# Patient Record
Sex: Male | Born: 1970 | Hispanic: Yes | Marital: Married | Smoking: Never smoker
Health system: Southern US, Community
[De-identification: ages and names within clinical notes are randomized; demographics above are authoritative.]

---

## 2006-03-19 ENCOUNTER — Emergency Department: Payer: Self-pay | Admitting: Emergency Medicine

## 2016-08-21 ENCOUNTER — Emergency Department
Admission: EM | Admit: 2016-08-21 | Discharge: 2016-08-21 | Disposition: A | Discharge status: Home / Self Care | Payer: Self-pay | Attending: Emergency Medicine | Admitting: Emergency Medicine

## 2016-08-21 ENCOUNTER — Encounter: Payer: Self-pay | Admitting: Emergency Medicine

## 2016-08-21 ENCOUNTER — Emergency Department: Payer: Self-pay

## 2016-08-21 DIAGNOSIS — N201 Calculus of ureter: Secondary | ICD-10-CM | POA: Insufficient documentation

## 2016-08-21 DIAGNOSIS — N23 Unspecified renal colic: Secondary | ICD-10-CM | POA: Insufficient documentation

## 2016-08-21 LAB — URINALYSIS, COMPLETE (UACMP) WITH MICROSCOPIC
Bacteria, UA: NONE SEEN
Bilirubin Urine: NEGATIVE
Glucose, UA: NEGATIVE mg/dL
HGB URINE DIPSTICK: NEGATIVE
KETONES UR: NEGATIVE mg/dL
LEUKOCYTES UA: NEGATIVE
NITRITE: NEGATIVE
PH: 7 (ref 5.0–8.0)
PROTEIN: NEGATIVE mg/dL
Specific Gravity, Urine: 1.012 (ref 1.005–1.030)

## 2016-08-21 MED ORDER — TAMSULOSIN HCL 0.4 MG PO CAPS: 0.4000 mg | ORAL_CAPSULE | Freq: Every day | ORAL | 0 refills | Status: DC

## 2016-08-21 MED ORDER — NAPROXEN 500 MG PO TABS: 500.0000 mg | ORAL_TABLET | Freq: Two times a day (BID) | ORAL | 0 refills | Status: DC

## 2016-08-21 MED ORDER — PHENAZOPYRIDINE HCL 200 MG PO TABS: 200.0000 mg | ORAL_TABLET | Freq: Three times a day (TID) | ORAL | 0 refills | Status: DC | PRN

## 2016-08-21 NOTE — ED Triage Notes (Signed)
[facility] for right lower , groin pain that has been coming and going for 3 weeks, increasing to today is worse, 10/10. Pt also feels pain in testicles. At this time pain is 6/10

## 2016-08-21 NOTE — Discharge Instructions (Signed)
Your CT scan shows a 6 mm kidney stone just outside the bladder on the right side. This is the cause of your symptoms. By taking Flomax every day, this may be allowed to pass into the bladder on its own without requiring urological intervention. If your symptoms do not resolve over the weekend, please follow-up with urology on Monday.

## 2016-08-21 NOTE — ED Notes (Signed)
See triage note  States he has been having pain to lower bad for about 3 weeks ,off and on  But states today the pain became worse this am  Pain is moving into testicle area  Denies any n/v or blood in [place]

## 2016-08-21 NOTE — ED Provider Notes (Signed)
[facility] Emergency Department Provider Note  ____________________________________________  Time seen: Approximately 1:52 PM  I have reviewed the triage vital signs and the nursing notes.   HISTORY  Chief Complaint Abdominal Pain    HPI Erik Lawrence is a 46 y.o. male who complains of right flank pain radiating to the right lower quadrant and groin for the past week, colicky lasting up to an hour at a time, but today is been worse and constant for the past 5 hours. No nausea vomiting fevers chills or sweats. He does have some dysuria but no hematuria or frequency or urgency. At times the pain even radiates to the right testicle. Denies trauma. Has a history of kidney stone and this feels similar.     History reviewed. No pertinent past medical history. Kidney stone  There are no active problems to display for this patient.    History reviewed. No pertinent surgical history.   Prior to Admission medications   Medication Sig Start Date End Date Taking? Authorizing Provider  naproxen (NAPROSYN) 500 MG tablet Take 1 tablet (500 mg total) by mouth 2 (two) times daily with a meal. 08/21/16   Sharman Cheek, MD  phenazopyridine (PYRIDIUM) 200 MG tablet Take 1 tablet (200 mg total) by mouth 3 (three) times daily as needed for pain. 08/21/16   Sharman Cheek, MD  tamsulosin (FLOMAX) 0.4 MG CAPS capsule Take 1 capsule (0.4 mg total) by mouth daily. 08/21/16   Sharman Cheek, MD     Allergies Patient has no known allergies.   History reviewed. No pertinent family history.  Social History Social History  Substance Use Topics  . Smoking status: Never Smoker  . Smokeless tobacco: Never Used  . Alcohol use Yes    Review of Systems  Constitutional:   No fever or chills.  ENT:   No sore throat. No rhinorrhea. Cardiovascular:   No chest pain. Respiratory:   No dyspnea or cough. Gastrointestinal:   Right flank and right lower quadrant abdominal pain  without vomiting and diarrhea.  Genitourinary:   Positive dysuria. Musculoskeletal:   Negative for focal pain or swelling Neurological:   Negative for headaches 10-point ROS otherwise negative.  ____________________________________________   PHYSICAL EXAM:  VITAL SIGNS: ED Triage Vitals  Enc Vitals Group     BP 08/21/16 1208 115/63     Pulse Rate 08/21/16 1208 94     Resp --      Temp 08/21/16 1208 97.4 F (36.3 C)     Temp Source 08/21/16 1208 Oral     SpO2 08/21/16 1208 99 %     Weight 08/21/16 1209 271 lb (122.9 kg)     Height 08/21/16 1209 5\' 9"  (1.753 m)     Head Circumference --      Peak Flow --      Pain Score 08/21/16 1210 6     Pain Loc --      Pain Edu? --      Excl. in GC? --     Vital signs reviewed, nursing assessments reviewed.   Constitutional:   Alert and oriented. Well appearing and in no distress. Eyes:   No scleral icterus. No conjunctival pallor. PERRL. EOMI.  No nystagmus. ENT   Head:   Normocephalic and atraumatic.   Nose:   No congestion/rhinnorhea. No septal hematoma   Mouth/Throat:   MMM, no pharyngeal erythema. No peritonsillar mass.    Neck:   No stridor. No SubQ emphysema. No meningismus. Hematological/Lymphatic/Immunilogical:  No cervical lymphadenopathy. Cardiovascular:   RRR. Symmetric bilateral radial and DP pulses.  No murmurs.  Respiratory:   Normal respiratory effort without tachypnea nor retractions. Breath sounds are clear and equal bilaterally. No wheezes/rales/rhonchi. Gastrointestinal:   Soft With diffuse lower abdominal tenderness, mild. No focal tenderness at McBurney's point.. Non distended. There is no CVA tenderness.  No rebound, rigidity, or guarding. Genitourinary:   Uncircumcised male, normal genitalia. No meatal discharge or lesions. Testes and epididymis nontender. No scrotal edema. Unremarkable perineum. No hernia in the scrotum or inguinal canal with Valsalva. No inguinal lymphadenopathy or  masses. Musculoskeletal:   Normal range of motion in all extremities. No joint effusions.  No lower extremity tenderness.  No edema. Neurologic:   Normal speech and language.  CN 2-10 normal. Motor grossly intact. No gross focal neurologic deficits are appreciated.  Skin:    Skin is warm, dry and intact. No rash noted.  No petechiae, purpura, or bullae.  ____________________________________________    LABS (pertinent positives/negatives) (all labs ordered are listed, but only abnormal results are displayed) Labs Reviewed  URINALYSIS, COMPLETE (UACMP) WITH MICROSCOPIC - Abnormal; Notable for the following:       Result Value   Color, Urine YELLOW (*)    APPearance CLEAR (*)    Squamous Epithelial / LPF 0-5 (*)    All other components within normal limits   ____________________________________________   EKG    ____________________________________________    RADIOLOGY  Ct Renal Stone Study  Result Date: 08/21/2016 CLINICAL DATA:  Right flank pain radiating to the groin, present for 3 weeks but increased today. EXAM: CT ABDOMEN AND PELVIS WITHOUT CONTRAST TECHNIQUE: Multidetector CT imaging of the abdomen and pelvis was performed following the standard protocol without IV contrast. COMPARISON:  03/19/2006 FINDINGS: Lower chest:  No contributory findings. Hepatobiliary: Hepatic steatosis with sparing at the gallbladder fossa.No evidence of biliary obstruction or stone. Pancreas: Unremarkable. Spleen: Unremarkable. Adrenals/Urinary Tract: Negative adrenals. 6 x 3 mm stone just above the right ureteral vesicular junction with moderate hydroureteronephrosis. Asymmetric right perinephric edema. Bilateral renal calculi, on coronal reformats 2 seen on the left and 3 on the right. The right-sided stone measures up to 5 mm. Left-sided calculu measures up to 5 mm in the upper pole. Presumed lower hilar cyst on the left. No left hydronephrosis. Unremarkable bladder. Stomach/Bowel:  No  obstruction. No appendicitis. Vascular/Lymphatic: No acute vascular abnormality. No mass or adenopathy. Reproductive:No pathologic findings. Other: No ascites or pneumoperitoneum. Musculoskeletal: No acute abnormalities. IMPRESSION: 1. Obstructing 6 x 3 mm stone just above the right UVJ. 2. Bilateral nephrolithiasis. 3. Hepatic steatosis. Electronically Signed   By: Marnee Spring M.D.   On: 08/21/2016 13:21    ____________________________________________   PROCEDURES Procedures  ____________________________________________   INITIAL IMPRESSION / ASSESSMENT AND PLAN / ED COURSE  Pertinent labs & imaging results that were available during my care of the patient were reviewed by me and considered in my medical decision making (see chart for details).  Patient presents with symptoms consistent with renal colic. Due to chronicity, CT scan performed. This shows a 5-6 mm stone at the right UVJ consistent with his symptoms. Start the patient on Flomax. Naproxen and Pyridium for symptom relief. Follow-up with urology in 3-4 days if symptoms do not resolve. No evidence of concurrent urinary tract infection pyelonephritis sepsis or abscess formation.         ____________________________________________   FINAL CLINICAL IMPRESSION(S) / ED DIAGNOSES  Final diagnoses:  Ureterolithiasis  Ureteral colic  New Prescriptions   NAPROXEN (NAPROSYN) 500 MG TABLET    Take 1 tablet (500 mg total) by mouth 2 (two) times daily with a meal.   PHENAZOPYRIDINE (PYRIDIUM) 200 MG TABLET    Take 1 tablet (200 mg total) by mouth 3 (three) times daily as needed for pain.   TAMSULOSIN (FLOMAX) 0.4 MG CAPS CAPSULE    Take 1 capsule (0.4 mg total) by mouth daily.     Portions of this note were generated with dragon dictation software. Dictation errors may occur despite best attempts at proofreading.    Sharman CheekPhillip Jahmar Mckelvy, MD 08/21/16 1357

## 2017-11-21 IMAGING — CT CT RENAL STONE PROTOCOL
2 of 4 series · 17 of 46 positions shown, 19 images · non-contrast
Comparison: 03/19/2006

CLINICAL DATA: Right flank pain radiating to the groin, present for
3 weeks but increased today.

EXAM:
CT ABDOMEN AND PELVIS WITHOUT CONTRAST
TECHNIQUE: Multidetector CT imaging of the abdomen and pelvis was performed
following the standard protocol without IV contrast.

[Series 2: stone full standard · axial · 0.76mm/px · z∈[-773,-333]mm · 14 of 97 slices shown, 16 images]
[im 5/97  soft-tissue]
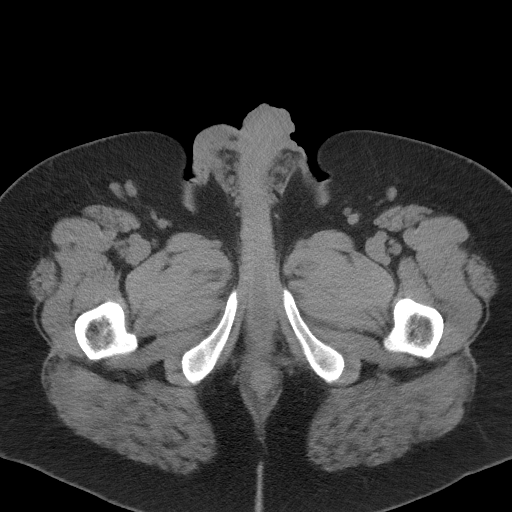
[im 5/97  bone]
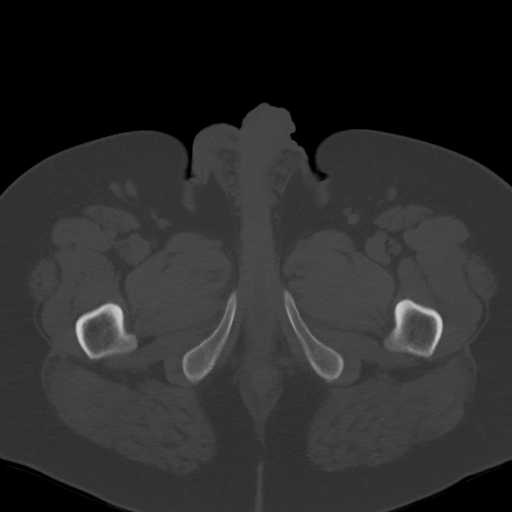
[im 13/97  soft-tissue]
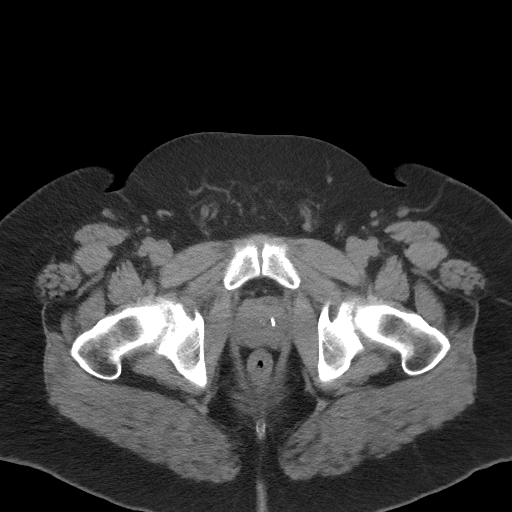
[im 21/97  soft-tissue]
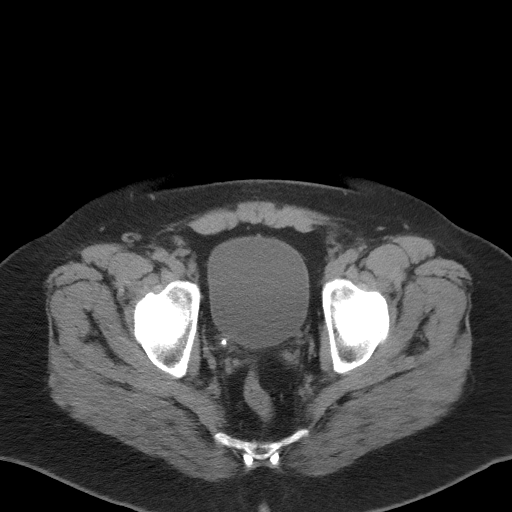
[im 25/97  soft-tissue]
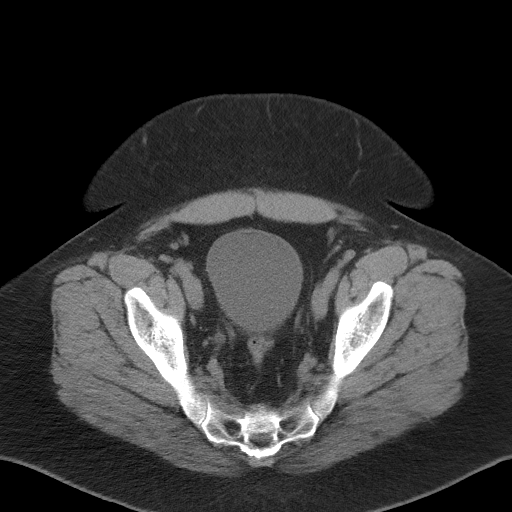
[im 33/97  soft-tissue]
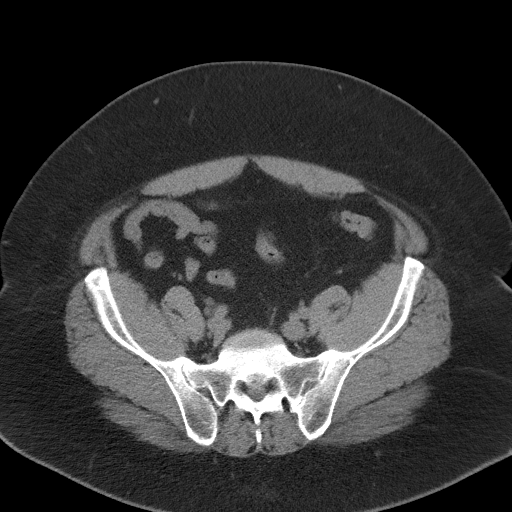
[im 41/97  soft-tissue]
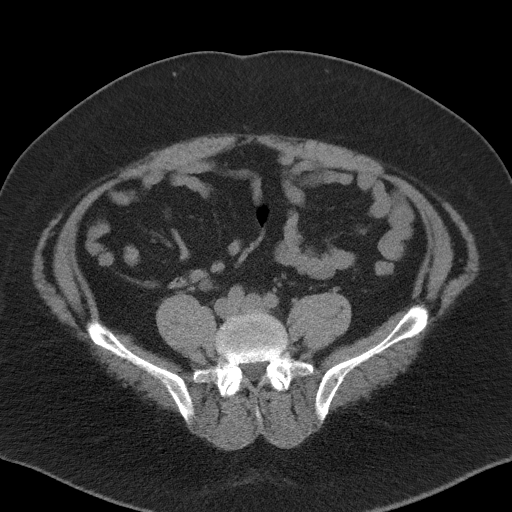
[im 45/97  soft-tissue]
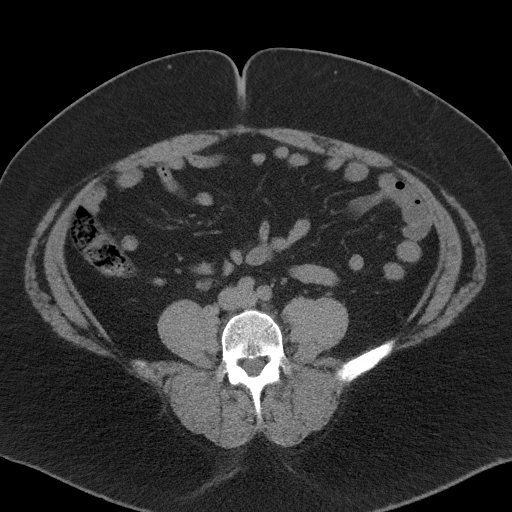
[im 53/97  soft-tissue]
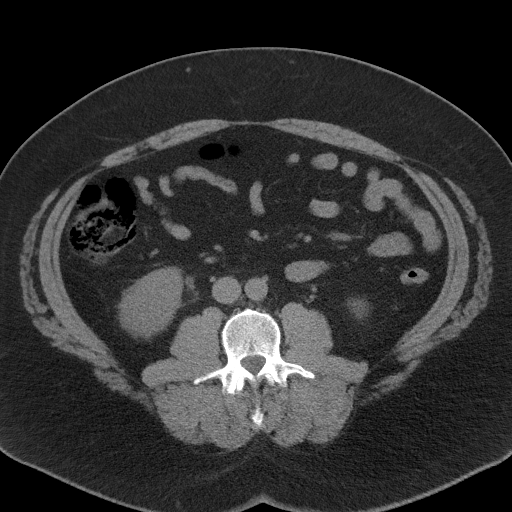
[im 57/97  soft-tissue]
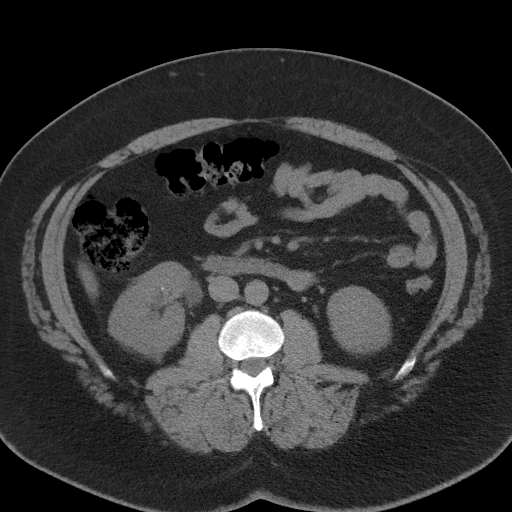
[im 57/97  bone]
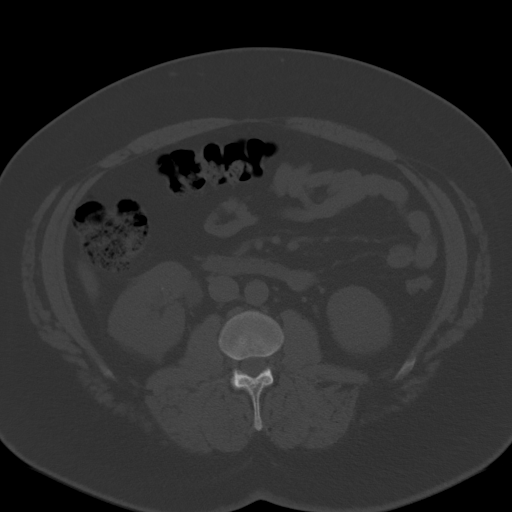
[im 65/97  soft-tissue]
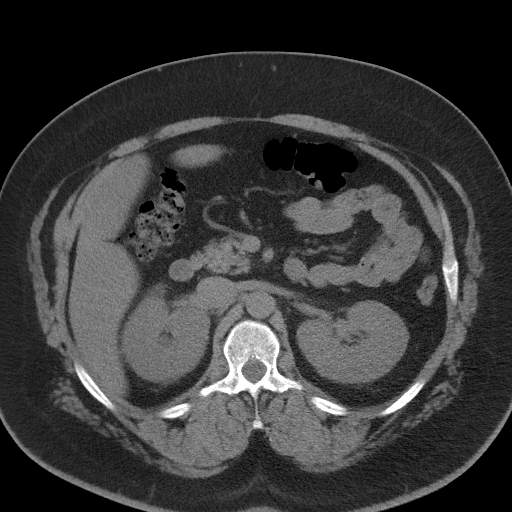
[im 73/97  soft-tissue]
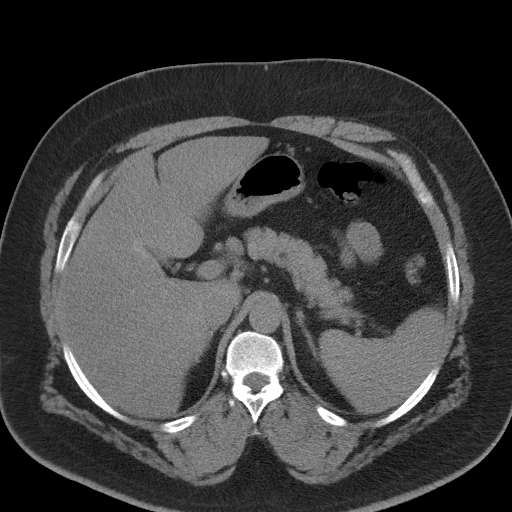
[im 77/97  soft-tissue]
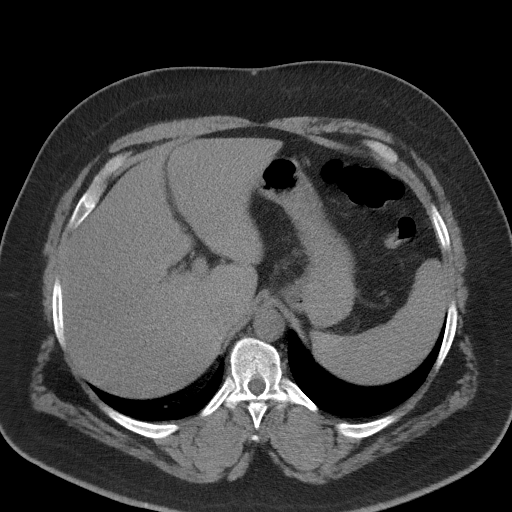
[im 85/97  soft-tissue]
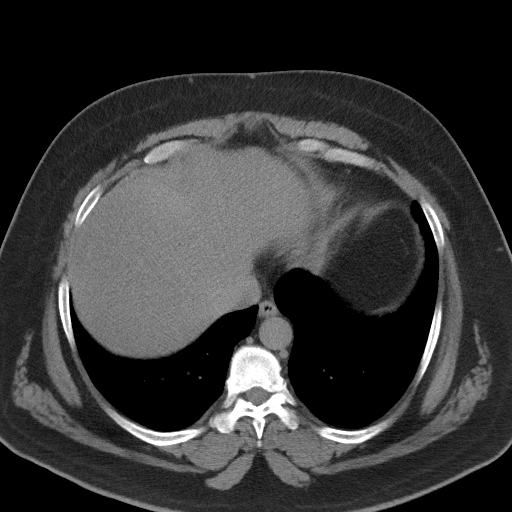
[im 93/97  soft-tissue]
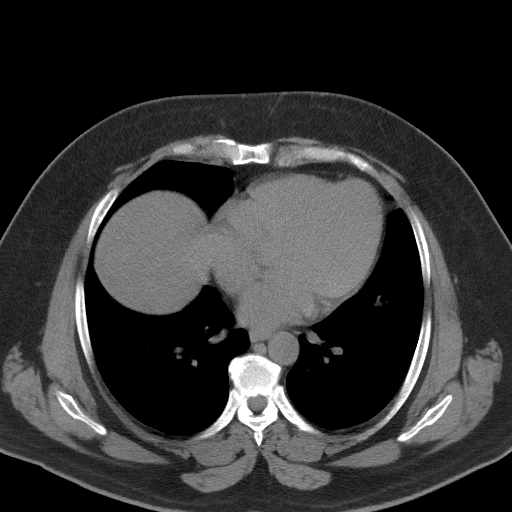

[Series 5: coronal · coronal · 0.89mm/px · 3 of 165 slices shown]
[im 55/165  soft-tissue]
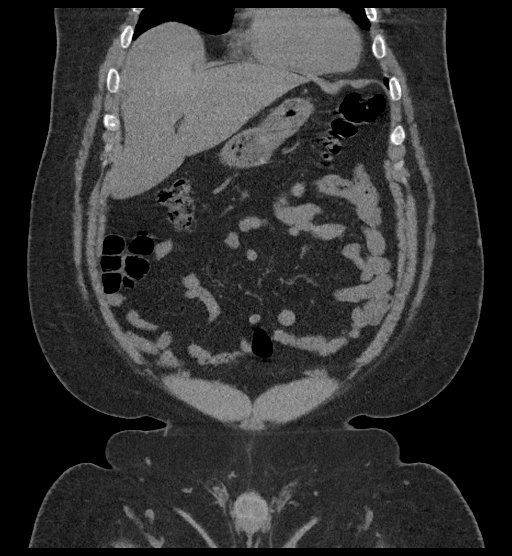
[im 73/165  soft-tissue]
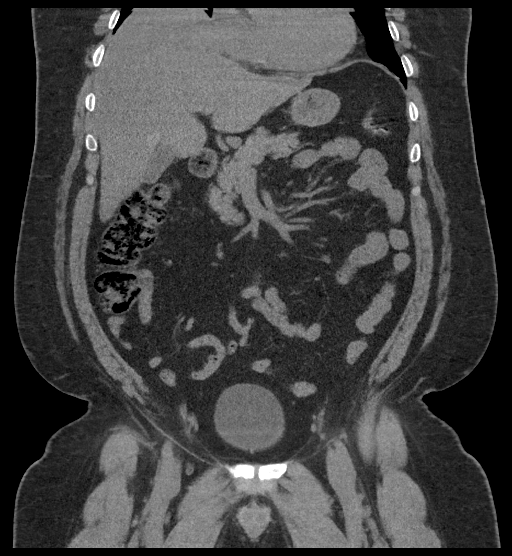
[im 92/165  soft-tissue]
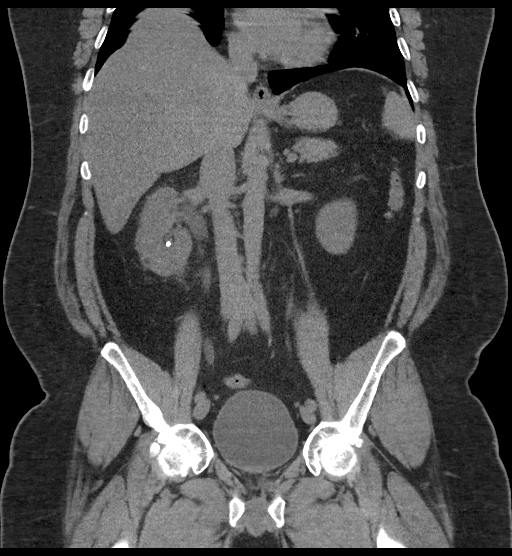

[17 of 46 positions shown; findings below may reference images not displayed]

FINDINGS: Lower chest:  No contributory findings.

Hepatobiliary: Hepatic steatosis with sparing at the gallbladder
fossa.No evidence of biliary obstruction or stone.

Pancreas: Unremarkable.

Spleen: Unremarkable.

Adrenals/Urinary Tract: Negative adrenals. 6 x 3 mm stone just above
the right ureteral vesicular junction with moderate
hydroureteronephrosis. Asymmetric right perinephric edema. Bilateral
renal calculi, on coronal reformats 2 seen on the left and 3 on the
right. The right-sided stone measures up to 5 mm. Left-sided calculu
measures up to 5 mm in the upper pole. Presumed lower hilar cyst on
the left. No left hydronephrosis. Unremarkable bladder.

Stomach/Bowel:  No obstruction. No appendicitis.

Vascular/Lymphatic: No acute vascular abnormality. No mass or
adenopathy.

Reproductive:No pathologic findings.

Other: No ascites or pneumoperitoneum.

Musculoskeletal: No acute abnormalities.
IMPRESSION: 1. Obstructing 6 x 3 mm stone just above the right UVJ.
2. Bilateral nephrolithiasis.
3. Hepatic steatosis.

## 2020-03-08 ENCOUNTER — Encounter: Payer: Self-pay | Admitting: Family Medicine

## 2020-03-08 ENCOUNTER — Ambulatory Visit: Payer: Self-pay | Admitting: Family Medicine

## 2020-03-08 ENCOUNTER — Other Ambulatory Visit: Payer: Self-pay

## 2020-03-08 VITALS — BP 151/86 | HR 70 | Resp 18 | Ht 69.0 in | Wt 288.4 lb

## 2020-03-08 DIAGNOSIS — Z024 Encounter for examination for driving license: Secondary | ICD-10-CM

## 2020-03-08 NOTE — Progress Notes (Signed)
Subjective:    Patient ID: Erik Lawrence, male    DOB: [birth year], 49 y.o.   MRN: [id number]  Erik Lawrence is a 49 y.o. male presenting on 03/08/2020 for Employment Physical   HPI  Patient presents to clinic for DOT PE  No flowsheet data found.  Social History   Tobacco Use  . Smoking status: Never Smoker  . Smokeless tobacco: Never Used  Substance Use Topics  . Alcohol use: Yes  . Drug use: No    Review of Systems  Constitutional: Negative.   HENT: Negative.   Eyes: Negative.   Respiratory: Negative.   Cardiovascular: Negative.   Gastrointestinal: Negative.   Endocrine: Negative.   Genitourinary: Negative.   Musculoskeletal: Negative.   Skin: Negative.   Allergic/Immunologic: Negative.   Neurological: Negative.   Hematological: Negative.   Psychiatric/Behavioral: Negative.    Per HPI unless specifically indicated above     Objective:    BP (!) 151/86 (BP Location: Left Arm, Patient Position: Sitting, Cuff Size: Large)   Pulse 70   Resp 18   Ht 5\' 9"  (1.753 m)   Wt 288 lb 6.4 oz (130.8 kg)   SpO2 100%   BMI 42.59 kg/m   Wt Readings from Last 3 Encounters:  03/08/20 288 lb 6.4 oz (130.8 kg)  08/21/16 271 lb (122.9 kg)    Physical Exam Vitals reviewed.  Constitutional:      General: He is not in acute distress.    Appearance: Normal appearance. He is well-developed and well-groomed. He is morbidly obese. He is not ill-appearing or toxic-appearing.  HENT:     Head: Normocephalic.     Right Ear: Tympanic membrane, ear canal and external ear normal. There is no impacted cerumen.     Left Ear: Tympanic membrane, ear canal and external ear normal. There is no impacted cerumen.     Nose: Nose normal. No congestion or rhinorrhea.     Mouth/Throat:     Mouth: Mucous membranes are moist.     Pharynx: Oropharynx is clear. No oropharyngeal exudate or posterior oropharyngeal erythema.  Eyes:     General: Lids are normal. Vision grossly intact. No scleral  icterus.       Right eye: No discharge.        Left eye: No discharge.     Extraocular Movements: Extraocular movements intact.     Conjunctiva/sclera: Conjunctivae normal.     Pupils: Pupils are equal, round, and reactive to light.  Cardiovascular:     Rate and Rhythm: Normal rate and regular rhythm.     Pulses: Normal pulses.          Dorsalis pedis pulses are 2+ on the right side and 2+ on the left side.     Heart sounds: Normal heart sounds. No murmur heard.  No friction rub. No gallop.   Pulmonary:     Effort: Pulmonary effort is normal. No respiratory distress.     Breath sounds: Normal breath sounds. No wheezing, rhonchi or rales.  Abdominal:     General: Abdomen is flat. Bowel sounds are normal. There is no distension.     Palpations: Abdomen is soft.     Tenderness: There is no abdominal tenderness. There is no right CVA tenderness, left CVA tenderness, guarding or rebound.     Hernia: No hernia is present.     Comments: Unable to palpate for hepatomegaly or splenomegaly due to body habitus  Musculoskeletal:        General:  Normal range of motion.     Cervical back: Normal range of motion and neck supple. No rigidity or tenderness.     Right lower leg: No edema.     Left lower leg: No edema.     Comments: Normal tone, 5/5 strength BUE & BLE  Feet:     Right foot:     Skin integrity: Skin integrity normal.     Left foot:     Skin integrity: Skin integrity normal.  Lymphadenopathy:     Cervical: No cervical adenopathy.  Skin:    General: Skin is warm and dry.     Capillary Refill: Capillary refill takes less than 2 seconds.  Neurological:     General: No focal deficit present.     Mental Status: He is alert and oriented to person, place, and time.     Cranial Nerves: Cranial nerves are intact. No cranial nerve deficit.     Sensory: Sensation is intact. No sensory deficit.     Motor: Motor function is intact. No weakness.     Coordination: Coordination is intact.  Coordination normal.     Gait: Gait is intact. Gait normal.     Deep Tendon Reflexes: Reflexes are normal and symmetric. Reflexes normal.  Psychiatric:        Attention and Perception: Attention and perception normal.        Mood and Affect: Mood and affect normal.        Speech: Speech normal.        Behavior: Behavior normal. Behavior is cooperative.        Thought Content: Thought content normal.        Cognition and Memory: Cognition and memory normal.        Judgment: Judgment normal.    Results for orders placed or performed during the hospital encounter of 08/21/16  Urinalysis, Complete w Microscopic  Result Value Ref Range   Color, Urine YELLOW (A) YELLOW   APPearance CLEAR (A) CLEAR   Specific Gravity, Urine 1.012 1.005 - 1.030   pH 7.0 5.0 - 8.0   Glucose, UA NEGATIVE NEGATIVE mg/dL   Hgb urine dipstick NEGATIVE NEGATIVE   Bilirubin Urine NEGATIVE NEGATIVE   Ketones, ur NEGATIVE NEGATIVE mg/dL   Protein, ur NEGATIVE NEGATIVE mg/dL   Nitrite NEGATIVE NEGATIVE   Leukocytes, UA NEGATIVE NEGATIVE   RBC / HPF 0-5 0 - 5 RBC/hpf   WBC, UA 0-5 0 - 5 WBC/hpf   Bacteria, UA NONE SEEN NONE SEEN   Squamous Epithelial / LPF 0-5 (A) NONE SEEN   Mucus PRESENT       Assessment & Plan:   Problem List Items Addressed This Visit      Other   Encounter for Department of Transportation (DOT) examination for trucking license - Primary    DOT Certificate provided x 1 year  Hearing test: Pass at 15' Vision: 20/20 R, 20/30 L, 20/20 Both Uncorrected Urine 1.020, Neg Protein, Trace Glucose, Neg Hematuria  A1C 6.4%  STOPBANG 5/8          No orders of the defined types were placed in this encounter.  Follow up plan: Return in about 1 year (around 03/08/2021) for DOT PE.   Charlaine Dalton, FNP Family Nurse Practitioner [facility] 03/08/2020, 10:00 AM

## 2020-03-08 NOTE — Assessment & Plan Note (Signed)
DOT Certificate provided x 1 year  Hearing test: Pass at 15' Vision: 20/20 R, 20/30 L, 20/20 Both Uncorrected Urine 1.020, Neg Protein, Trace Glucose, Neg Hematuria  A1C 6.4%  STOPBANG 5/8

## 2020-03-08 NOTE — Patient Instructions (Signed)
DOT Certificate Provided x 1 year for elevated BP
